# Patient Record
Sex: Female | Born: 2005 | Race: Black or African American | Hispanic: No | Marital: Single | Smoking: Never smoker
Health system: Southern US, Community
[De-identification: ages and names within clinical notes are randomized; demographics above are authoritative.]

## PROBLEM LIST (undated history)

## (undated) ENCOUNTER — Ambulatory Visit (HOSPITAL_COMMUNITY): Payer: MEDICAID | Source: Home / Self Care

## (undated) ENCOUNTER — Emergency Department (HOSPITAL_COMMUNITY): Admission: EM | Payer: Medicaid Other | Source: Home / Self Care

## (undated) DIAGNOSIS — F32A Depression, unspecified: Secondary | ICD-10-CM

## (undated) DIAGNOSIS — T7840XA Allergy, unspecified, initial encounter: Secondary | ICD-10-CM

## (undated) DIAGNOSIS — L309 Dermatitis, unspecified: Secondary | ICD-10-CM

## (undated) DIAGNOSIS — J302 Other seasonal allergic rhinitis: Secondary | ICD-10-CM

## (undated) HISTORY — DX: Dermatitis, unspecified: L30.9

## (undated) HISTORY — DX: Allergy, unspecified, initial encounter: T78.40XA

## (undated) HISTORY — DX: Depression, unspecified: F32.A

---
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
## 2023-03-22 ENCOUNTER — Encounter (HOSPITAL_COMMUNITY): Payer: Self-pay | Admitting: Emergency Medicine

## 2023-03-22 ENCOUNTER — Ambulatory Visit (HOSPITAL_COMMUNITY)
Admission: EM | Admit: 2023-03-22 | Discharge: 2023-03-22 | Disposition: A | Discharge status: Home / Self Care | Payer: MEDICAID | Attending: Family Medicine | Admitting: Family Medicine

## 2023-03-22 ENCOUNTER — Ambulatory Visit (INDEPENDENT_AMBULATORY_CARE_PROVIDER_SITE_OTHER): Payer: MEDICAID

## 2023-03-22 DIAGNOSIS — M25572 Pain in left ankle and joints of left foot: Secondary | ICD-10-CM

## 2023-03-22 MED ORDER — DICLOFENAC SODIUM 75 MG PO TBEC: 75.0000 mg | DELAYED_RELEASE_TABLET | Freq: Two times a day (BID) | ORAL | 0 refills | Status: AC
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
## 2024-01-21 ENCOUNTER — Emergency Department (HOSPITAL_COMMUNITY)
Admission: EM | Admit: 2024-01-21 | Discharge: 2024-01-22 | Disposition: A | Payer: MEDICAID | Attending: Emergency Medicine | Admitting: Emergency Medicine

## 2024-01-21 ENCOUNTER — Encounter (HOSPITAL_COMMUNITY): Payer: Self-pay | Admitting: *Deleted

## 2024-01-21 ENCOUNTER — Emergency Department (HOSPITAL_COMMUNITY): Payer: MEDICAID

## 2024-01-21 ENCOUNTER — Other Ambulatory Visit: Payer: Self-pay

## 2024-01-21 DIAGNOSIS — S93401A Sprain of unspecified ligament of right ankle, initial encounter: Secondary | ICD-10-CM
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
## 2024-01-22 MED ORDER — DICLOFENAC SODIUM 1 % EX GEL: 2.0000 g | Freq: Four times a day (QID) | CUTANEOUS | 0 refills | Status: AC

## 2024-01-22 MED ORDER — DICLOFENAC SODIUM 1 % EX GEL
2.0000 g | Freq: Once | CUTANEOUS | Status: AC
  Administered 2024-01-22: 2 g via TOPICAL
  Filled 2024-01-22: qty 100

## 2024-01-22 NOTE — Discharge Instructions (Addendum)
 Wear brace as needed. You can elevate the ankle and apply ice as needed for pain for 20 minutes at a time over a thin cloth. Apply Voltaren  gel to area as needed as prescribed. Follow-up with orthopedics in 1 week if not improving.
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
## 2024-02-19 ENCOUNTER — Other Ambulatory Visit: Payer: Self-pay

## 2024-02-19 ENCOUNTER — Emergency Department (HOSPITAL_COMMUNITY)
Admission: EM | Admit: 2024-02-19 | Discharge: 2024-02-19 | Disposition: A | Discharge status: Home / Self Care | Payer: MEDICAID | Attending: Emergency Medicine | Admitting: Emergency Medicine

## 2024-02-19 DIAGNOSIS — J0100 Acute maxillary sinusitis, unspecified: Secondary | ICD-10-CM | POA: Diagnosis not present

## 2024-02-19 DIAGNOSIS — M940 Chondrocostal junction syndrome [Tietze]: Secondary | ICD-10-CM | POA: Insufficient documentation

## 2024-02-19 DIAGNOSIS — R059 Cough, unspecified: Secondary | ICD-10-CM | POA: Diagnosis present

## 2024-02-19 MED ORDER — NAPROXEN 500 MG PO TABS: 500.0000 mg | ORAL_TABLET | Freq: Two times a day (BID) | ORAL | 0 refills | Status: AC

## 2024-02-19 MED ORDER — BENZONATATE 100 MG PO CAPS: 100.0000 mg | ORAL_CAPSULE | Freq: Three times a day (TID) | ORAL | 0 refills | Status: AC

## 2024-02-19 MED ORDER — NAPROXEN 250 MG PO TABS
500.0000 mg | ORAL_TABLET | Freq: Once | ORAL | Status: AC
  Administered 2024-02-19: 500 mg via ORAL
  Filled 2024-02-19: qty 2

## 2024-02-19 MED ORDER — AMOXICILLIN-POT CLAVULANATE 875-125 MG PO TABS: 1.0000 | ORAL_TABLET | Freq: Two times a day (BID) | ORAL | 0 refills | Status: AC

## 2024-02-19 MED ORDER — ACETAMINOPHEN 325 MG PO TABS
650.0000 mg | ORAL_TABLET | Freq: Once | ORAL | Status: AC
  Administered 2024-02-19: 650 mg via ORAL
  Filled 2024-02-19: qty 2

## 2024-02-19 NOTE — ED Triage Notes (Signed)
 Pt presents for cold/flu symptoms x 2 weeks.

## 2024-02-19 NOTE — ED Provider Notes (Signed)
 " [place] EMERGENCY DEPARTMENT AT [facility] Provider Note   CSN: [id number] Arrival date & time: 02/19/24  1648     Patient presents with: Cough   Heather [name] is a 19 y.o. female.   Cough Patient is a 19 year old female to the ED today for concerns for cough, congestion, sinus pressure, body aches, chills that have been ongoing x 2 weeks and increasing.  Notes originally had URI symptoms of fever, headache, worsening body aches that were starting to improved until bodyaches returned with increasing and sinus pressure.  Noted to have had worsening cough that is painful,  Coughing more when laying down.   Denies fever, odynophagia, tinnitus, otalgia, shortness of breath, abdominal pain, nausea, vomiting, diarrhea, lower leg swelling, rashes.  Prior to Admission medications  Medication Sig Start Date End Date Taking? Authorizing Provider  amoxicillin -clavulanate (AUGMENTIN ) 875-125 MG tablet Take 1 tablet by mouth every 12 (twelve) hours for 5 days. 02/19/24 02/24/24 Yes Keliyah Spillman S, PA-C  benzonatate  (TESSALON ) 100 MG capsule Take 1 capsule (100 mg total) by mouth every 8 (eight) hours. 02/19/24  Yes Shawnmichael Parenteau S, PA-C  naproxen  (NAPROSYN ) 500 MG tablet Take 1 tablet (500 mg total) by mouth 2 (two) times daily. 02/19/24  Yes Beola Terrall RAMAN, PA-C  diclofenac  (VOLTAREN ) 75 MG EC tablet Take 1 tablet (75 mg total) by mouth 2 (two) times daily. 03/22/23   Rolinda Rogue, MD  diclofenac  Sodium (VOLTAREN ) 1 % GEL Apply 2 g topically 4 (four) times daily. 01/22/24   Beverley Leita LABOR, PA-C  Oxcarbazepine  (TRILEPTAL ) 300 MG tablet Take 1 tablet (300 mg total) by mouth 2 (two) times daily. 12/01/20 12/31/20  Doda, Vandana, MD    Allergies: Patient has no known allergies.    Review of Systems  HENT:  Positive for congestion.   Respiratory:  Positive for cough.   All other systems reviewed and are negative.   Updated Vital Signs BP 109/70   Pulse 73   Temp 98.6 F (37 C)   Resp  16   Ht 5' 3.5 (1.613 m)   Wt 63 kg   LMP 01/04/2024   SpO2 100%   BMI 24.24 kg/m   Physical Exam Vitals and nursing note reviewed.  Constitutional:      General: She is not in acute distress.    Appearance: Normal appearance. She is not ill-appearing or diaphoretic.  HENT:     Head: Normocephalic and atraumatic.     Right Ear: Tympanic membrane, ear canal and external ear normal.     Left Ear: Tympanic membrane, ear canal and external ear normal.     Nose: Congestion present.     Comments: Inflamed nasal passages bilaterally.  Additionally noted to have tenderness to percussion over bilateral maxillary sinuses. Eyes:     General: No scleral icterus.       Right eye: No discharge.        Left eye: No discharge.     Extraocular Movements: Extraocular movements intact.     Conjunctiva/sclera: Conjunctivae normal.  Cardiovascular:     Rate and Rhythm: Normal rate and regular rhythm.     Pulses: Normal pulses.     Heart sounds: Normal heart sounds. No murmur heard.    No friction rub. No gallop.  Pulmonary:     Effort: Pulmonary effort is normal. No respiratory distress.     Breath sounds: No stridor. No wheezing, rhonchi or rales.  Chest:     Chest wall:  No tenderness.  Abdominal:     General: Abdomen is flat. There is no distension.     Palpations: Abdomen is soft.     Tenderness: There is no abdominal tenderness. There is no right CVA tenderness, left CVA tenderness, guarding or rebound.  Musculoskeletal:        General: No swelling, deformity or signs of injury.     Cervical back: Normal range of motion. No rigidity.     Right lower leg: No edema.     Left lower leg: No edema.  Skin:    General: Skin is warm and dry.     Findings: No bruising, erythema or lesion.  Neurological:     General: No focal deficit present.     Mental Status: She is alert and oriented to person, place, and time. Mental status is at baseline.     Sensory: No sensory deficit.     Motor: No  weakness.  Psychiatric:        Mood and Affect: Mood normal.     (all labs ordered are listed, but only abnormal results are displayed) Labs Reviewed - No data to display  EKG: None  Radiology: No results found.   Procedures   Medications Ordered in the ED  naproxen  (NAPROSYN ) tablet 500 mg (500 mg Oral Given 02/19/24 1901)  acetaminophen  (TYLENOL ) tablet 650 mg (650 mg Oral Given 02/19/24 1901)      Medical Decision Making This patient is a 19 year old female who presents to the ED for concern of increase sinus congestion after URI approximately 2 weeks ago has had persistent symptoms with recurrent body aches and chills with increased sinus pain.  Noted to also have worsening chest pain on cough.  No shortness of breath.  On physical exam, patient is in no acute distress, afebrile, alert and orient x 4, speaking in full sentences, nontachypneic, nontachycardic.  Notably has inflammation in bilateral nasal passageways and tenderness to percussion over right maxillary sinus.  Suspecting sinusitis.  Lungs are clear to auscultation without any signs of pneumonia, rhonchi.  Oropharynx clear without any signs of infection.  TMs clear bilaterally.  Suspecting likely sinusitis as patient cause of symptoms today.  Will send home with Augmentin  as well as cough medication anti-inflammatories to help with the costochondritis which she is feeling over her chest with coughing.  Low suspicion for pneumonia, pharyngitis.  Patient vital signs have remained stable throughout the course of patient's time in the ED. Low suspicion for any other emergent pathology at this time. I believe this patient is safe to be discharged. Provided strict return to ER precautions. Patient expressed agreement and understanding of plan. All questions were answered.  Differential diagnoses prior to evaluation: The emergent differential diagnosis includes, but is not limited to, pneumonia, costochondritis, pharyngitis,  URI, bronchitis. This is not an exhaustive differential.   Past Medical History / Co-morbidities / Social History: Eczema, depression  Additional history: Chart reviewed. Pertinent results include:   Last seen in the ER on 12/7/125 for ankle sprain.    Medications: I ordered medication including Augmentin , Tessalon , naproxen , Tylenol .  I have reviewed the patients home medicines and have made adjustments as needed.  Critical Interventions: None  Social Determinants of Health: Has follow-up with PCP  Disposition: After consideration of the diagnostic results and the patients response to treatment, I feel that the patient would benefit from discharge and treat as above.   emergency department workup does not suggest an emergent condition requiring admission or immediate  intervention beyond what has been performed at this time. The plan is: With PCP, centimeters at home, Augmentin  for sinusitis. The patient is safe for discharge and has been instructed to return immediately for worsening symptoms, change in symptoms or any other concerns.   Final diagnoses:  Acute non-recurrent maxillary sinusitis  Costochondritis    ED Discharge Orders          Ordered    naproxen  (NAPROSYN ) 500 MG tablet  2 times daily        02/19/24 1857    benzonatate  (TESSALON ) 100 MG capsule  Every 8 hours        02/19/24 1857    amoxicillin -clavulanate (AUGMENTIN ) 875-125 MG tablet  Every 12 hours        02/19/24 1857               Beola Terrall RAMAN, [place] 02/19/24 1903    Franklyn Sid SAILOR, MD 02/19/24 1921  "

## 2024-02-19 NOTE — Discharge Instructions (Addendum)
 You were seen today for sinusitis and costochondritis.  Sending in some indication to help with the inflammation in your lungs, additionally sending in some cough medication to help suppress the cough.  Additionally am also sending in some antibiotics which you are to take for the next 5 days, twice a day.  This can cause stomach upset and diarrhea, please take with food to help minimize its effect.  Follow-up with PCP if you have any persistent symptoms.   Return to the ER if you begin to have any new or worsening symptoms which include shortness of breath on exertion, worsening chest pain, recurrent fever, lethargy.

## 2024-02-19 NOTE — ED Provider Triage Note (Signed)
 Emergency Medicine Provider Triage Evaluation Note  Heather [name] , a 19 y.o. female  was evaluated in triage.  Pt complains of congestion, headache, chest pain with coughing. Noting to initially have fever, headache x 2 weeks ago that improved but then began to suffer from increased sinus pressure and painful, dry cough. Been taking theraflu without relief.   Endorses body aches, chills, watery diarrhea.   Denies recurrent fever, shortness of breath, abdominal pain, dysuria, melena, hematochezia, vaginal bleeding/discharge, LE swelling, rashes.   Review of Systems  Positive: N/a Negative: N/a  Physical Exam  BP 109/70   Pulse 73   Temp 98.6 F (37 C)   Resp 16   Ht 5' 3.5 (1.613 m)   Wt 63 kg   LMP 01/04/2024   SpO2 100%   BMI 24.24 kg/m  Gen:   Awake, no distress   Resp:  Normal effort  MSK:   Moves extremities without difficulty  Other:    Medical Decision Making  Medically screening exam initiated at 6:46 PM.  Appropriate orders placed.  Heather [name] was informed that the remainder of the evaluation will be completed by another provider, this initial triage assessment does not replace that evaluation, and the importance of remaining in the ED until their evaluation is complete.     Beola Terrall RAMAN, [place] 02/19/24 [phone]
